# Patient Record
Sex: Male | Born: 1990 | Race: White | Hispanic: No | Marital: Married | Smoking: Never smoker
Health system: Southern US, Community
[De-identification: ages and names within clinical notes are randomized; demographics above are authoritative.]

---
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
## 2009-08-14 ENCOUNTER — Ambulatory Visit: Payer: Self-pay | Admitting: Family Medicine

## 2009-09-26 ENCOUNTER — Ambulatory Visit: Payer: Self-pay | Admitting: Sports Medicine

## 2009-09-26 DIAGNOSIS — M7750 Other enthesopathy of unspecified foot: Secondary | ICD-10-CM | POA: Insufficient documentation

## 2009-10-15 ENCOUNTER — Telehealth: Payer: Self-pay | Admitting: *Deleted

## 2009-10-21 ENCOUNTER — Ambulatory Visit: Payer: Self-pay | Admitting: Family Medicine

## 2009-10-21 DIAGNOSIS — D1800 Hemangioma unspecified site: Secondary | ICD-10-CM | POA: Insufficient documentation

## 2010-04-08 NOTE — Assessment & Plan Note (Signed)
Summary: NP,CHRONIC FOOT PAIN,MC   Vital Signs:  Patient profile:   20 year old male Height:      73.75 inches Weight:      196.25 pounds BMI:     25.46 Pulse rate:   70 / minute BP sitting:   109 / 71  (left arm)  Vitals Entered By: Terese Door (September 26, 2009 2:48 PM) CC: NP-chronic right foot pain Pain Assessment Patient in pain? no        CC:  NP-chronic right foot pain.  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  Located under 1st MT head.  Radiates sometimes to midway down ling arch.  No pain felt dorsally, no swelling.  No injury.  Worse with running, better with rest.  Not an avid runner but gets pain mostly in his baseball cleats.  Hasnt tried any meds.  Worsening.      Current Medications (verified): 1)  None  Allergies (verified): No Known Drug Allergies  Review of Systems       See HPI  Physical Exam  General:  Well-developed,well-nourished,in no acute distress; alert,appropriate and cooperative throughout examination Msk:  Ankle:R No visible erythema or swelling. Range of motion is full in all directions. Strength is 5/5 in all directions. Stable lateral and medial ligaments; squeeze test and kleiger test unremarkable; Talar dome nontender; No pain at base of 5th MT; No tenderness over cuboid; No tenderness over N spot or navicular prominence No tenderness on posterior aspects of lateral and medial malleolus No sign of peroneal tendon subluxations; Negative tarsal tunnel tinel's Able to walk 4 steps. TTP under 1st MT head.   pes Cavus.  Walks and runs with a very wide gait causing him to land on his 1st MT heads.  L foot unremarkable and painless.   Impression & Recommendations:  Problem # 1:  FOOT PAIN (ICD-729.5) Assessment Improved  This is metatarsalgia of the 1st MTP joint.  We made him a sports insole with padding behind 1st MT head and a cutout where his MT head would plant.  I places  these in his cleats and he ran outside with immediate and significant improvement in his symptoms.  Placed another insole in his other clear without padding.   suspect that cleat post is causing contusion to 1 Mtp AREA  Orders: Sports Insoles ([id number])  Problem # 2:  METATARSALGIA (ICD-726.70) Assessment: New  See #1.  Orders: Sports Insoles [phone])

## 2010-04-08 NOTE — Assessment & Plan Note (Signed)
Summary: consult re: problems with toe/cjr   Vital Signs:  Patient profile:   20 year old male Temp:     17 degrees F oral  Vitals Entered By: Sid Falcon LPN (August 14, 1608 3:38 PM)   History of Present Illness: Almost one year hx of R foot pain somewhat poorly localized but predominantly around MTP joint with spread proximally and ventrally.  No specific injury.  Pain is intermittent and described as sharp with occ burning quality.  Moderate severity.  Worse with running and esp exercise that requires a lot of "cutting" and change of direction.  Also worse with wear of flip-flops.  Improved with periods of rest.  Pain radiates somewhat proximally toward med malleolus.  Has never seen any ecchymosis.  No bony tenderness.  Hx of mild pronation.  Pt plays baseball and plans to walk on at [employer] this year.  Playing summer baseball league and able to run, though with some pain.  Allergies: No Known Drug Allergies  Past History:  Past Medical History: Last updated: 10/08/2008 no chronic problems.  Past Surgical History: Last updated: 07/26/2008 Elbow scope 2008  Social History: Last updated: 10/08/2008 [employer] fall 2010 Single Never Smoked  Risk Factors: Smoking Status: never (10/08/2008)  Review of Systems      See HPI  Physical Exam  General:  Well-developed,well-nourished,in no acute distress; alert,appropriate and cooperative throughout examination Extremities:  R foot.  No bony tenderness and no visible edema.  Full ROM ankle.  No plantar fascia tenderness.  Normal distal foot pulses.  R great toe nontender and full ROM. Neurologic:  normal sensation to touch.  Full strength with plantar and dorsi flexion.    Impression & Recommendations:  Problem # 1:  FOOT PAIN (ICD-729.5)  ?tarsal tunnel syndrome.  No hx of injury.  Rec refer to [facility] clinic for opinion regarding etiology and possible therapy.   Orders: Sports Medicine (Sports Med)  Patient  Instructions: 1)  We will call you regarding appointment to see sports medicine specialist.

## 2010-04-08 NOTE — Assessment & Plan Note (Signed)
Summary: consult re: red spot on pts back/cjr/Resched/cb   Vital Signs:  Patient profile:   20 year old male Weight:      198 pounds Temp:     98.0 degrees F oral  Vitals Entered By: Duard Brady LPN (October 21, 2009 1:09 PM) CC: check small red spot on back  Is Patient Diabetic? No   History of Present Illness: Here with asymptomatic reddish bump mid back. Present for several months and not actively growing. No itching or bleeding.  Preventive Screening-Counseling & Management  Alcohol-Tobacco     Smoking Status: never  Allergies (verified): No Known Drug Allergies  Past History:  Past Medical History: Last updated: 10/08/2008 no chronic problems.  Physical Exam  General:  Well-developed,well-nourished,in no acute distress; alert,appropriate and cooperative throughout examination Skin:  small 2-18mm slighltly raised reddish lesion.   Impression & Recommendations:  Problem # 1:  HEMANGIOMA (ICD-228.00) reassured this is benign.

## 2010-04-08 NOTE — Progress Notes (Signed)
Summary: Pt needs to get copy of immunization record by 10/16/09  Phone Note Call from Patient Call back at [phone] Katrina cell   Caller: mom-Katrina Summary of Call: Pt is needing to get copy of immunization record in order to go to college. Pt is coming in for ov on 10/18/09, but has to have it in to the school by 10/16/09.  Initial call taken by: Lucy Antigua,  October 15, 2009 4:10 PM  Follow-up for Phone Call        Left message on mom's machine that we don't have a copy of child's childhood vaccines. The only ones we have are the Hep B, meningo and Td. She would need to get these from pt's high school or old peds office. The ones we have will be up front for her to pick up and if she has any other questions, she can call the office about this. Follow-up by: Romualdo Bolk, CMA Duncan Dull),  October 16, 2009 12:16 PM

## 2010-10-06 ENCOUNTER — Encounter: Payer: Self-pay | Admitting: Family Medicine

## 2010-10-06 ENCOUNTER — Ambulatory Visit (INDEPENDENT_AMBULATORY_CARE_PROVIDER_SITE_OTHER): Payer: BC Managed Care – PPO | Admitting: Family Medicine

## 2010-10-06 VITALS — BP 120/78 | Temp 98.4°F | Ht 78.75 in | Wt 208.0 lb

## 2010-10-06 DIAGNOSIS — R51 Headache: Secondary | ICD-10-CM

## 2010-10-06 MED ORDER — AMOXICILLIN-POT CLAVULANATE 875-125 MG PO TABS: 1.0000 | ORAL_TABLET | Freq: Two times a day (BID) | ORAL | Status: AC

## 2010-10-06 NOTE — Progress Notes (Signed)
  Subjective:    Patient ID: Jorge Boone, male    DOB: Jul 17, 1990, 20 y.o.   MRN: [id number]  HPI 4 to five-day history of intermittent headache and sinus congestion and pressure mostly bilateral maxillary. Symptoms seem worse in morning. Had some nausea but no vomiting. No abdominal symptoms or diarrhea. Denies sore throat, laryngitis, or earache. No history frequent sinus problems previously. No recent allergies.  Has couple of urticarial pruritic spots anterior chest.  Noted couple days ago. No pustules. No generalized rash   Review of Systems  Constitutional: Negative for fever and chills.  HENT: Negative for congestion, sore throat, sneezing, voice change, postnasal drip and ear discharge.   Eyes: Negative for visual disturbance.  Respiratory: Negative for cough and shortness of breath.   Neurological: Positive for headaches.       Objective:   Physical Exam  Constitutional: He appears well-developed and well-nourished. No distress.  HENT:  Right Ear: External ear normal.  Left Ear: External ear normal.  Nose: Nose normal.  Mouth/Throat: Oropharynx is clear and moist. No oropharyngeal exudate.  Neck: Neck supple.  Cardiovascular: Normal rate, regular rhythm and normal heart sounds.   No murmur heard. Pulmonary/Chest: Effort normal and breath sounds normal. No respiratory distress. He has no wheezes. He has no rales.  Musculoskeletal: He exhibits no edema.  Lymphadenopathy:    He has no cervical adenopathy.  Skin:       Patient's couple of urticarial type lesions anterior chest which blanch with pressure. No pustules. Nonscaly  Psychiatric: He has a normal mood and affect.          Assessment & Plan:  #1 sinus headache. No evidence for clear acute sinusitis. If patient develops any fever or continued symptoms start Augmentin 875 mg twice daily for 10 days #2 urticaria anterior chest. Reassurance

## 2010-10-06 NOTE — Patient Instructions (Signed)
Over the counter decongestant such as sudafed for facial pain and headaches If facial pain persists or if you develop any fever or discolored nasal discharge start antibiotic

## 2010-10-07 ENCOUNTER — Telehealth: Payer: Self-pay | Admitting: *Deleted

## 2010-10-07 NOTE — Telephone Encounter (Signed)
Notified Mom

## 2010-10-07 NOTE — Telephone Encounter (Signed)
Mom called stating pt got up this am feeling worse and took the Augmentin.  Is nauseated and vomiting with severe headache around eyes.  Cannot eat.  Mom wonders if the Augmentin caused this ???  Needs advice from Dr. Caryl Never.  No fever that she is aware of or any drainage postnasal or nasal.

## 2010-10-07 NOTE — Telephone Encounter (Signed)
I think this could be the sinus issue.  MAke sure he takes with some light food on stomach and try Augmentin once more tonight.  Usually tolerated well with food.  If has recurrent nausea/vomiting after the Augmentin can try another antibiotic.  Difficult to sort out as severe sinus problems can cause headache and vomiting too.  Make sure they are in touch if he develops any fever or especially any stiff neck.

## 2010-10-09 ENCOUNTER — Telehealth: Payer: Self-pay | Admitting: *Deleted

## 2010-10-09 ENCOUNTER — Ambulatory Visit (INDEPENDENT_AMBULATORY_CARE_PROVIDER_SITE_OTHER)
Admission: RE | Admit: 2010-10-09 | Discharge: 2010-10-09 | Disposition: A | Discharge status: Home / Self Care | Payer: BC Managed Care – PPO | Source: Ambulatory Visit | Attending: Internal Medicine | Admitting: Internal Medicine

## 2010-10-09 ENCOUNTER — Encounter: Payer: Self-pay | Admitting: Family Medicine

## 2010-10-09 ENCOUNTER — Ambulatory Visit (INDEPENDENT_AMBULATORY_CARE_PROVIDER_SITE_OTHER): Payer: BC Managed Care – PPO | Admitting: Family Medicine

## 2010-10-09 DIAGNOSIS — J329 Chronic sinusitis, unspecified: Secondary | ICD-10-CM

## 2010-10-09 DIAGNOSIS — R51 Headache: Secondary | ICD-10-CM

## 2010-10-09 MED ORDER — PROMETHAZINE HCL 25 MG/ML IJ SOLN
25.0000 mg | INTRAMUSCULAR | Status: AC
  Administered 2010-10-09: 25 mg via INTRAMUSCULAR

## 2010-10-09 MED ORDER — PROMETHAZINE HCL 25 MG PO TABS: 25.0000 mg | ORAL_TABLET | Freq: Four times a day (QID) | ORAL | Status: AC | PRN

## 2010-10-09 NOTE — Progress Notes (Signed)
Quick Note:  Will call in ______

## 2010-10-09 NOTE — Telephone Encounter (Signed)
New med called in

## 2010-10-09 NOTE — Telephone Encounter (Signed)
To be seen and has been scheduled.

## 2010-10-09 NOTE — Telephone Encounter (Signed)
Mom calls stating pt had a fairly good day yesterday taking Augmentin, but today has developed the vomiting again and headache.   Will not get out of bed and take his meds.

## 2010-10-09 NOTE — Progress Notes (Signed)
  Subjective:    Patient ID: Jorge Boone, male    DOB: 12-Oct-1990, 20 y.o.   MRN: [id number]  HPI Several weeks of progressive sinus symptoms-congestion and R frontal sinus pain. Started on Augmentin 2 days ago. Initially had some nausea and vomiting we felt may be related to Augmentin but yesterday took for a full day without any issues. This morning recurrent vomiting with progressive headache. No stiff neck. Some sweats but no confirmed fever. Headache is mostly right frontal and somewhat diffuse. No abdominal pain. Occasional loose stool. No purulent nasal discharge. No bloody nose. No cough. No confusion   Review of Systems  Constitutional: Positive for fatigue. Negative for fever.  HENT: Positive for congestion and sinus pressure. Negative for sore throat.   Eyes: Negative for visual disturbance.  Gastrointestinal: Positive for nausea and vomiting.  Neurological: Positive for headaches.       Objective:   Physical Exam  Constitutional: He is oriented to person, place, and time. He appears well-developed and well-nourished.  HENT:  Right Ear: External ear normal.  Left Ear: External ear normal.  Nose: Nose normal.  Mouth/Throat: Oropharynx is clear and moist.  Eyes: Pupils are equal, round, and reactive to light.       Fundi benign  Neck: Neck supple.  Cardiovascular: Normal rate and regular rhythm.   Pulmonary/Chest: Effort normal and breath sounds normal. No respiratory distress. He has no wheezes. He has no rales.  Lymphadenopathy:    He has no cervical adenopathy.  Neurological: He is alert and oriented to person, place, and time.          Assessment & Plan:  Probable acute sinusitis. Patient has symptoms of some persistent nausea and vomiting and progressive headache. No confirmed fever. At this point vomiting seems unlikely secondary to Augmentin since he has not taken any today. Phenergan 25 mg IM given. Limited CT maxillofacial today.

## 2010-10-10 ENCOUNTER — Telehealth: Payer: Self-pay | Admitting: *Deleted

## 2010-10-10 NOTE — Telephone Encounter (Signed)
Pt feeling a little better today, Is eating, has small headache, but still does not feel like doing anything active.

## 2010-10-12 NOTE — Telephone Encounter (Signed)
Spoke with mom.  He is doing some better and they will be in touch if any recurrent severe headaches.

## 2012-12-15 ENCOUNTER — Ambulatory Visit (INDEPENDENT_AMBULATORY_CARE_PROVIDER_SITE_OTHER): Payer: BC Managed Care – PPO | Admitting: Family Medicine

## 2012-12-15 ENCOUNTER — Encounter: Payer: Self-pay | Admitting: Family Medicine

## 2012-12-15 VITALS — BP 120/70 | HR 78 | Temp 97.6°F | Wt 214.0 lb

## 2012-12-15 DIAGNOSIS — Z23 Encounter for immunization: Secondary | ICD-10-CM

## 2012-12-15 DIAGNOSIS — Z01818 Encounter for other preprocedural examination: Secondary | ICD-10-CM

## 2012-12-15 LAB — CBC WITH DIFFERENTIAL/PLATELET
Basophils Absolute: 0 10*3/uL (ref 0.0–0.1)
Basophils Relative: 0.5 % (ref 0.0–3.0)
Eosinophils Absolute: 0 10*3/uL (ref 0.0–0.7)
Eosinophils Relative: 0.1 % (ref 0.0–5.0)
HCT: 43.6 % (ref 39.0–52.0)
Hemoglobin: 15 g/dL (ref 13.0–17.0)
Lymphocytes Relative: 22.4 % (ref 12.0–46.0)
Lymphs Abs: 1.7 10*3/uL (ref 0.7–4.0)
MCHC: 34.5 g/dL (ref 30.0–36.0)
MCV: 88.1 fl (ref 78.0–100.0)
Monocytes Absolute: 0.4 10*3/uL (ref 0.1–1.0)
Monocytes Relative: 4.8 % (ref 3.0–12.0)
Neutro Abs: 5.4 10*3/uL (ref 1.4–7.7)
Neutrophils Relative %: 72.2 % (ref 43.0–77.0)
Platelets: 188 10*3/uL (ref 150.0–400.0)
RBC: 4.95 Mil/uL (ref 4.22–5.81)
RDW: 13 % (ref 11.5–14.6)
WBC: 7.5 10*3/uL (ref 4.5–10.5)

## 2012-12-15 LAB — BASIC METABOLIC PANEL
BUN: 17 mg/dL (ref 6–23)
CO2: 28 mEq/L (ref 19–32)
Calcium: 9.8 mg/dL (ref 8.4–10.5)
Chloride: 102 mEq/L (ref 96–112)
Creatinine, Ser: 1.1 mg/dL (ref 0.4–1.5)
GFR: 86.29 mL/min (ref >60.00)
Glucose, Bld: 84 mg/dL (ref 70–99)
Potassium: 4.2 mEq/L (ref 3.5–5.1)
Sodium: 139 mEq/L (ref 135–145)

## 2012-12-15 NOTE — Progress Notes (Signed)
  Subjective:    Patient ID: Jorge Boone, male    DOB: May 30, 1990, 22 y.o.   MRN: [id number]  HPI Patient here for surgical clearance. He plays baseball for [employer]. Left hip labrum tear over the summer. Arthroscopic surgery planned for next Thursday. He has no chronic medical problems. No history of any cardiac issues. Medications. No history of heart murmur. No syncope or dizziness with exercise. No history of dyspnea or chest pain.  Has not yet had flu vaccine and he does request.  No past medical history on file. Past Surgical History  Procedure Laterality Date  . Elbow arthroscopy  2008    reports that he has never smoked. He does not have any smokeless tobacco history on file. His alcohol and drug histories are not on file. family history is not on file. No Known Allergies    Review of Systems  Constitutional: Negative for fever, activity change, appetite change, fatigue and unexpected weight change.  HENT: Negative for congestion, ear pain and trouble swallowing.   Eyes: Negative for pain and visual disturbance.  Respiratory: Negative for cough, shortness of breath and wheezing.   Cardiovascular: Negative for chest pain and palpitations.  Gastrointestinal: Negative for nausea, vomiting, abdominal pain, diarrhea, constipation, blood in stool, abdominal distention and rectal pain.  Genitourinary: Negative for dysuria, hematuria and testicular pain.  Musculoskeletal: Negative for arthralgias and joint swelling.  Skin: Negative for rash.  Neurological: Negative for dizziness, syncope and headaches.  Hematological: Negative for adenopathy.  Psychiatric/Behavioral: Negative for confusion and dysphoric mood.       Objective:   Physical Exam  Constitutional: He appears well-developed and well-nourished.  HENT:  Right Ear: External ear normal.  Left Ear: External ear normal.  Mouth/Throat: Oropharynx is clear and moist.  Neck: Neck supple. No thyromegaly  present.  Cardiovascular: Normal rate and regular rhythm.  Exam reveals no friction rub.   No murmur heard. Pulmonary/Chest: Effort normal and breath sounds normal. No respiratory distress. He has no wheezes. He has no rales.  Abdominal: Soft. Bowel sounds are normal. He exhibits no distension and no mass. There is no tenderness. There is no rebound and no guarding.  Musculoskeletal: He exhibits no edema.  Lymphadenopathy:    He has no cervical adenopathy.          Assessment & Plan:  Healthy 22 year old male. Obtain presurgical CBC and basic metabolic panel. Flu vaccine given. No medical contraindications for surgery.  Surgeon is Dr Wallene Huh with [facility].

## 2012-12-16 ENCOUNTER — Telehealth: Payer: Self-pay

## 2012-12-16 NOTE — Telephone Encounter (Signed)
Talked to [facility] at Dr. Caswell Corwin office. Just a letter stating that the patient is cleared to have surgery. What do you want the letter to say and i will type to up.

## 2012-12-18 NOTE — Telephone Encounter (Signed)
I have created letter for patient.

## 2012-12-19 NOTE — Telephone Encounter (Signed)
Faxing letter to [place] at [phone] at Dr. Caswell Corwin office

## 2013-03-02 IMAGING — CT CT PARANASAL SINUSES LIMITED
1 series · 16 of 18 positions shown, 20 images · non-contrast
Comparison: CT head without contrast 08/12/2004.

CLINICAL DATA: Progressive headache and sinus pressure.

CT PARANASAL SINUSES WITHOUT CONTRAST
TECHNIQUE: Multidetector CT through the paranasal sinuses was
performed using the standard protocol without intravenous contrast.

[Series 3: ltd sinus 3.0 h30s · axial · 0.37mm/px · z∈[-83,+14]mm · 16 of 18 slices shown, 20 images]
[im 2/18  brain]
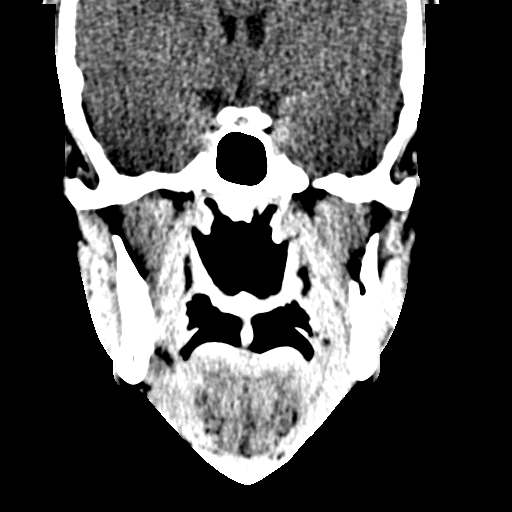
[im 2/18  bone]
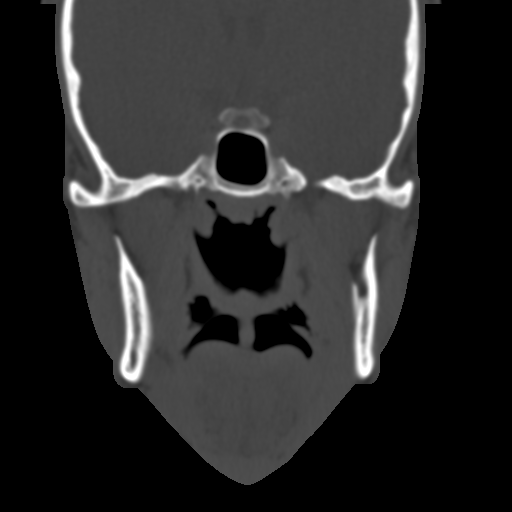
[im 3/18  bone]
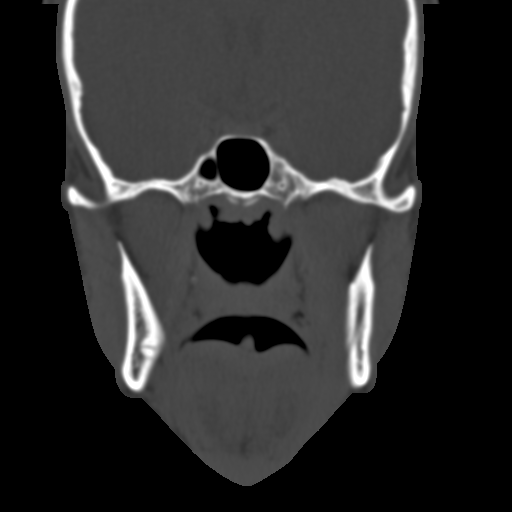
[im 4/18  bone]
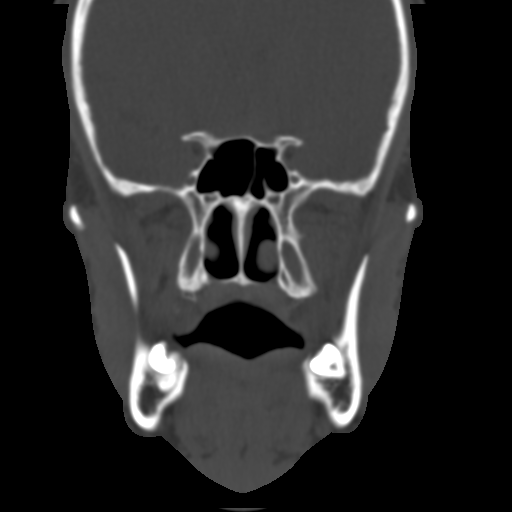
[im 5/18  bone]
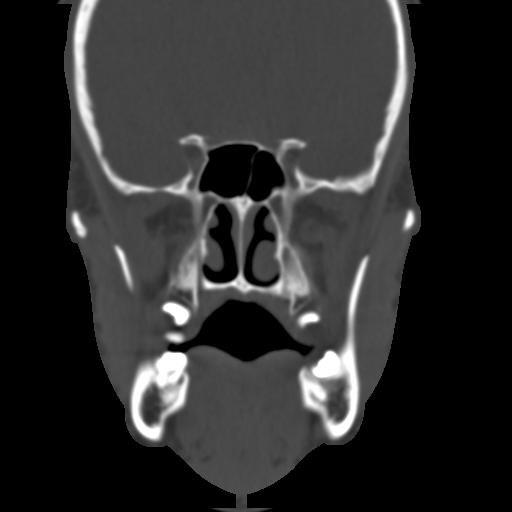
[im 6/18  brain]
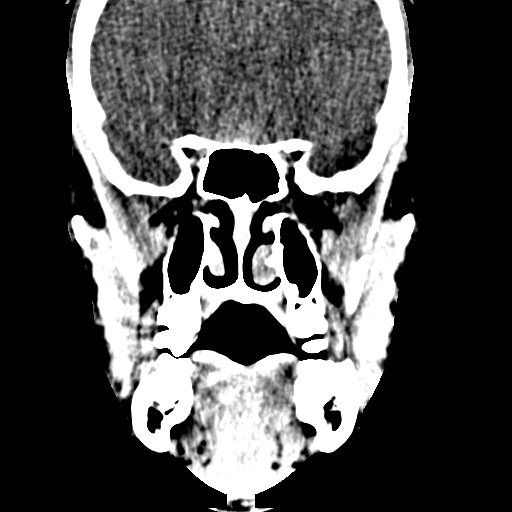
[im 6/18  bone]
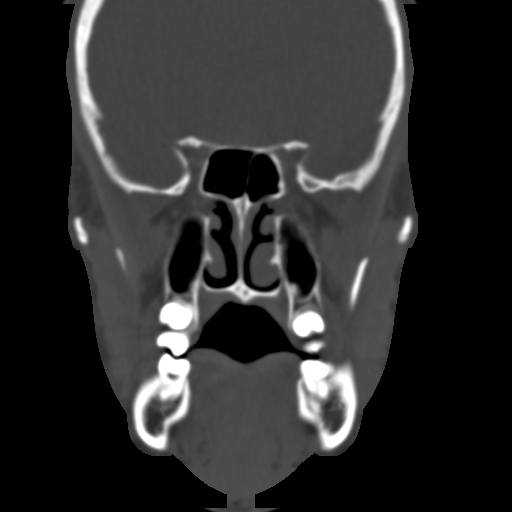
[im 7/18  bone]
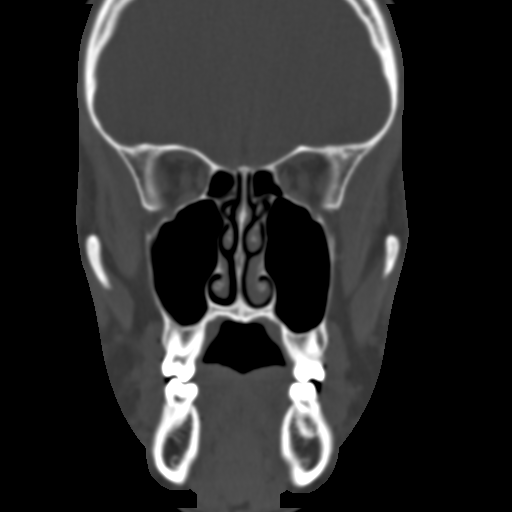
[im 8/18  bone]
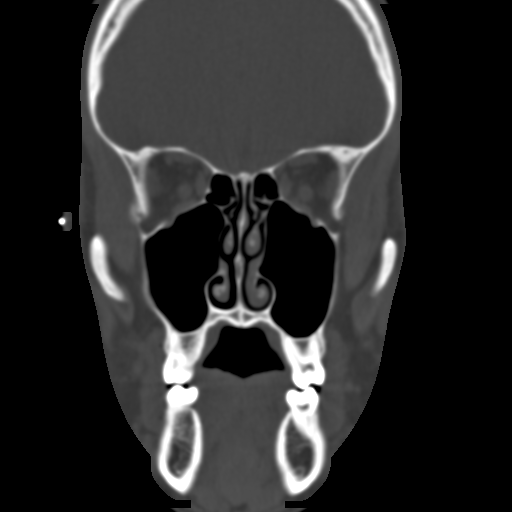
[im 9/18  bone]
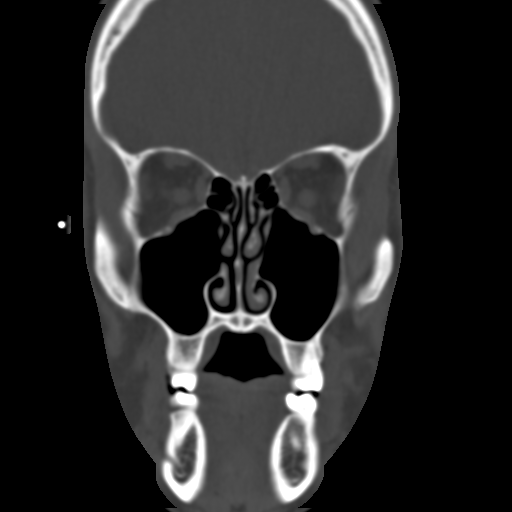
[im 10/18  brain]
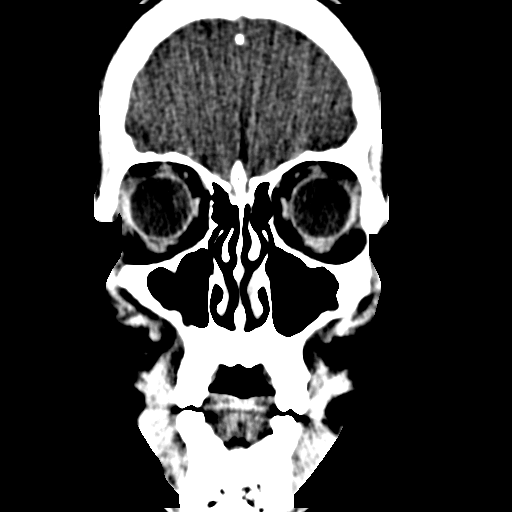
[im 10/18  bone]
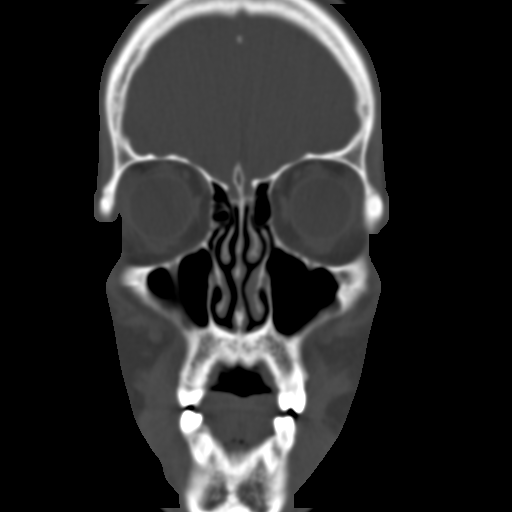
[im 11/18  bone]
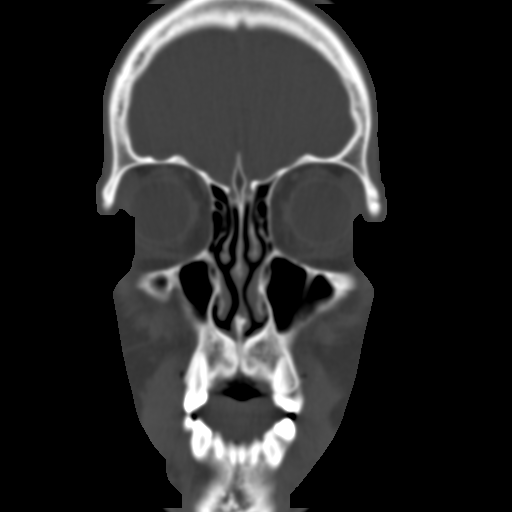
[im 12/18  bone]
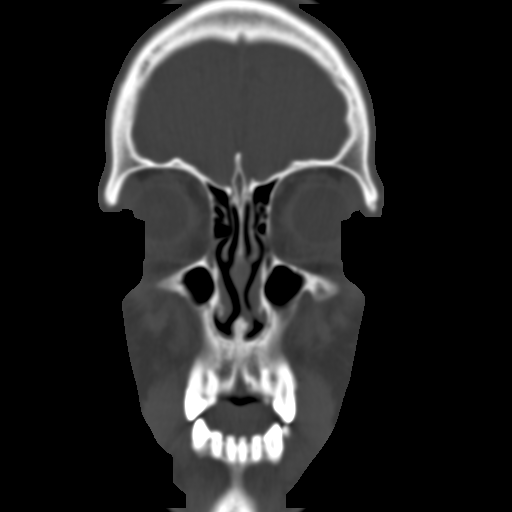
[im 13/18  bone]
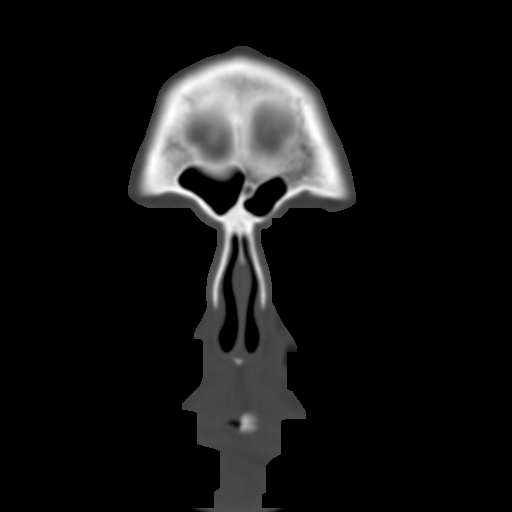
[im 14/18  brain]
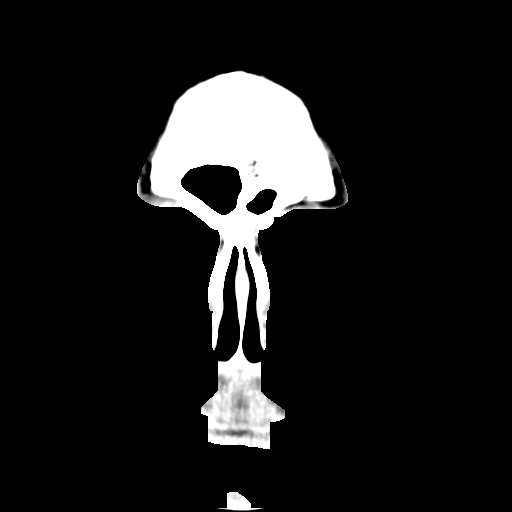
[im 14/18  bone]
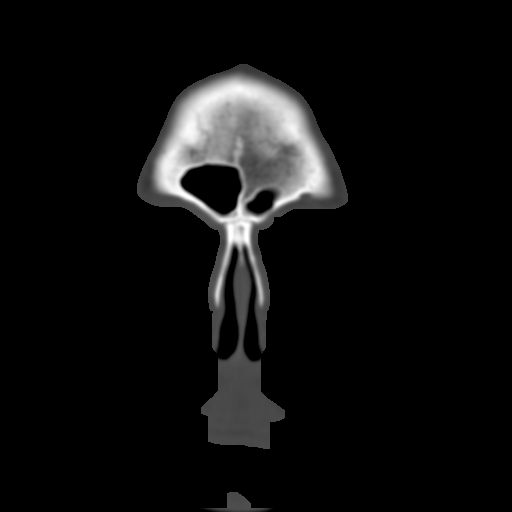
[im 15/18  bone]
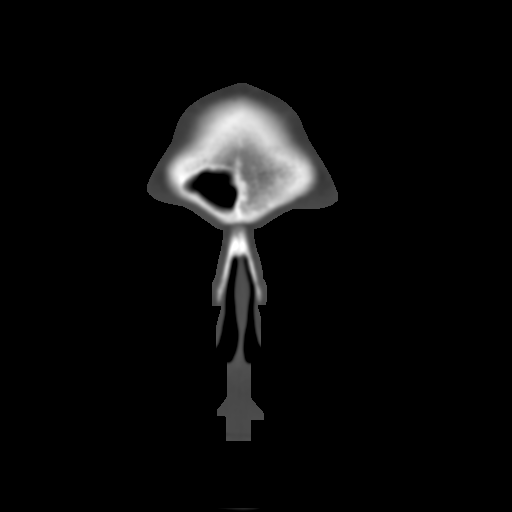
[im 16/18  bone]
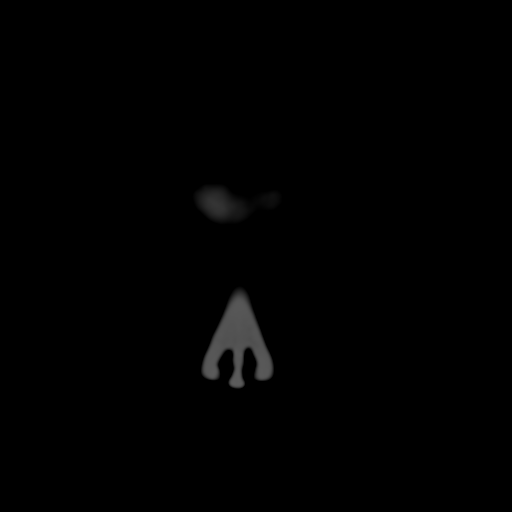
[im 17/18  bone]
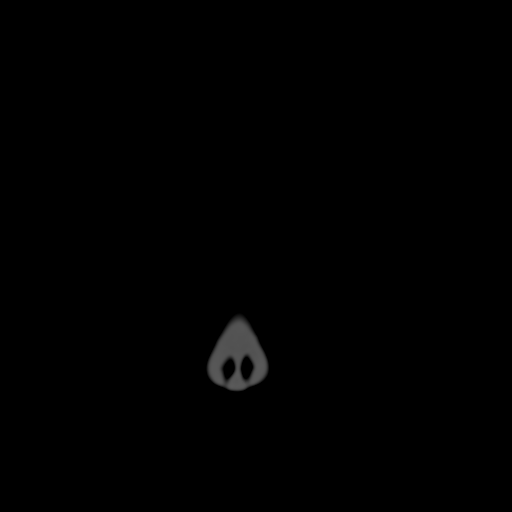

[16 of 18 positions shown; findings below may reference images not displayed]

FINDINGS: Screening examination of the paranasal sinuses
demonstrates no significant mucosal disease.  There is no
significant fluid.  No definite wall thickening is present.

Limited imaging of the brain is unremarkable.
IMPRESSION: Negative limited to CT of the sinuses.

## 2018-02-28 ENCOUNTER — Telehealth: Payer: Self-pay | Admitting: Family Medicine

## 2018-02-28 NOTE — Telephone Encounter (Signed)
Copied from [place] [id number]. Topic: Quick Communication - See Telephone Encounter >> Feb 28, 2018  3:35 PM Blase Mess A wrote: CRM for notification. See Telephone encounter for: 02/28/18.  Patient is requesting his immunization record. Please advise [phone]

## 2018-03-03 NOTE — Telephone Encounter (Signed)
Please see message . Thank you .

## 2018-03-10 NOTE — Telephone Encounter (Signed)
I spoke with Annette Stable and she is going to send the records from our office and call the patient. Thank you!

## 2018-03-10 NOTE — Telephone Encounter (Signed)
Pt called to follow up on immunization record. Please advise. CB#[phone]

## 2018-03-11 NOTE — Telephone Encounter (Signed)
I have printed off the immunization records from the patients chart and had Jorge Boone to print off the patients immunization records from the [place] state registry. I have mailed the records out today.
